# Patient Record
Sex: Male | Born: 1995
Health system: Southern US, Community
[De-identification: ages and names within clinical notes are randomized; demographics above are authoritative.]

## PROBLEM LIST (undated history)

## (undated) DIAGNOSIS — T7840XA Allergy, unspecified, initial encounter: Secondary | ICD-10-CM

## (undated) HISTORY — DX: Allergy, unspecified, initial encounter: T78.40XA

---

## 2017-10-12 ENCOUNTER — Ambulatory Visit: Payer: BLUE CROSS/BLUE SHIELD | Admitting: Family Medicine

## 2017-11-02 ENCOUNTER — Ambulatory Visit: Payer: BLUE CROSS/BLUE SHIELD | Admitting: Family Medicine

## 2017-11-06 ENCOUNTER — Encounter: Payer: Self-pay | Admitting: Family Medicine

## 2017-11-06 ENCOUNTER — Ambulatory Visit: Payer: BLUE CROSS/BLUE SHIELD | Admitting: Family Medicine

## 2017-11-06 VITALS — BP 120/64 | HR 64 | Ht 72.0 in | Wt 188.0 lb

## 2017-11-06 DIAGNOSIS — F329 Major depressive disorder, single episode, unspecified: Secondary | ICD-10-CM

## 2017-11-06 DIAGNOSIS — F4321 Adjustment disorder with depressed mood: Secondary | ICD-10-CM

## 2017-11-06 DIAGNOSIS — Z0000 Encounter for general adult medical examination without abnormal findings: Secondary | ICD-10-CM

## 2017-11-06 DIAGNOSIS — F99 Mental disorder, not otherwise specified: Secondary | ICD-10-CM

## 2017-11-06 DIAGNOSIS — L709 Acne, unspecified: Secondary | ICD-10-CM | POA: Insufficient documentation

## 2017-11-06 NOTE — Patient Instructions (Signed)
Stress and Stress Management Stress is a normal reaction to life events. It is what you feel when life demands more than you are used to or more than you can handle. Some stress can be useful. For example, the stress reaction can help you catch the last bus of the day, study for a test, or meet a deadline at work. But stress that occurs too often or for too long can cause problems. It can affect your emotional health and interfere with relationships and normal daily activities. Too much stress can weaken your immune system and increase your risk for physical illness. If you already have a medical problem, stress can make it worse. What are the causes? All sorts of life events may cause stress. An event that causes stress for one person may not be stressful for another person. Major life events commonly cause stress. These may be positive or negative. Examples include losing your job, moving into a new home, getting married, having a baby, or losing a loved one. Less obvious life events may also cause stress, especially if they occur day after day or in combination. Examples include working long hours, driving in traffic, caring for children, being in debt, or being in a difficult relationship. What are the signs or symptoms? Stress may cause emotional symptoms including, the following:  Anxiety. This is feeling worried, afraid, on edge, overwhelmed, or out of control.  Anger. This is feeling irritated or impatient.  Depression. This is feeling sad, down, helpless, or guilty.  Difficulty focusing, remembering, or making decisions.  Stress may cause physical symptoms, including the following:  Aches and pains. These may affect your head, neck, back, stomach, or other areas of your body.  Tight muscles or clenched jaw.  Low energy or trouble sleeping.  Stress may cause unhealthy behaviors, including the following:  Eating to feel better (overeating) or skipping meals.  Sleeping too little,  too much, or both.  Working too much or putting off tasks (procrastination).  Smoking, drinking alcohol, or using drugs to feel better.  How is this diagnosed? Stress is diagnosed through an assessment by your health care provider. Your health care provider will ask questions about your symptoms and any stressful life events.Your health care provider will also ask about your medical history and may order blood tests or other tests. Certain medical conditions and medicine can cause physical symptoms similar to stress. Mental illness can cause emotional symptoms and unhealthy behaviors similar to stress. Your health care provider may refer you to a mental health professional for further evaluation. How is this treated? Stress management is the recommended treatment for stress.The goals of stress management are reducing stressful life events and coping with stress in healthy ways. Techniques for reducing stressful life events include the following:  Stress identification. Self-monitor for stress and identify what causes stress for you. These skills may help you to avoid some stressful events.  Time management. Set your priorities, keep a calendar of events, and learn to say "no." These tools can help you avoid making too many commitments.  Techniques for coping with stress include the following:  Rethinking the problem. Try to think realistically about stressful events rather than ignoring them or overreacting. Try to find the positives in a stressful situation rather than focusing on the negatives.  Exercise. Physical exercise can release both physical and emotional tension. The key is to find a form of exercise you enjoy and do it regularly.  Relaxation techniques. These relax the body and  mind. Examples include yoga, meditation, tai chi, biofeedback, deep breathing, progressive muscle relaxation, listening to music, being out in nature, journaling, and other hobbies. Again, the key is to find  one or more that you enjoy and can do regularly.  Healthy lifestyle. Eat a balanced diet, get plenty of sleep, and do not smoke. Avoid using alcohol or drugs to relax.  Strong support network. Spend time with family, friends, or other people you enjoy being around.Express your feelings and talk things over with someone you trust.  Counseling or talktherapy with a mental health professional may be helpful if you are having difficulty managing stress on your own. Medicine is typically not recommended for the treatment of stress.Talk to your health care provider if you think you need medicine for symptoms of stress. Follow these instructions at home:  Keep all follow-up visits as directed by your health care provider.  Take all medicines as directed by your health care provider. Contact a health care provider if:  Your symptoms get worse or you start having new symptoms.  You feel overwhelmed by your problems and can no longer manage them on your own. Get help right away if:  You feel like hurting yourself or someone else. This information is not intended to replace advice given to you by your health care provider. Make sure you discuss any questions you have with your health care provider. Document Released: 12/27/2000 Document Revised: 12/09/2015 Document Reviewed: 02/25/2013 Elsevier Interactive Patient Education  2017 [employer]  

## 2017-11-06 NOTE — Progress Notes (Signed)
Name: Anthony LedgerRobert Pecora   MRN: [id number]    DOB: 08/09/1995   Date:11/06/2017       Progress Note  Subjective  Chief Complaint  Chief Complaint  Patient presents with  . Establish Care  . Depression    doesn't want meds    Patient presents to establish care and discuss recent mental health issue.  Depression         This is a new problem.  The current episode started more than 1 month ago.   The onset quality is sudden.   Duration: 4-6 weeks.    The problem has been gradually improving since onset.  Associated symptoms include decreased concentration, helplessness, insomnia, irritable, decreased interest and sad.  Associated symptoms include no fatigue, no hopelessness, no restlessness, no appetite change, no body aches, no myalgias, no headaches and no suicidal ideas.     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  Past treatments include nothing (discuss current and future adaptations).  Compliance with treatment is good.   No problem-specific Assessment & Plan notes found for this encounter.   Past Medical History:  Diagnosis Date  . Allergy     History reviewed. No pertinent surgical history.  Family History  Problem Relation Age of Onset  . Hypertension Mother   . Cancer Maternal Grandmother     Social History   Socioeconomic History  . Marital status: Single    Spouse name: Not on file  . Number of children: Not on file  . Years of education: Not on file  . Highest education level: Not on file  Occupational History  . Not on file  Social Needs  . Financial resource strain: Not on file  . Food insecurity:    Worry: Not on file    Inability: Not on file  . Transportation needs:    Medical: Not on file    Non-medical: Not on file  Tobacco Use  . Smoking status: Never Smoker  . Smokeless tobacco: Never Used  Substance and Sexual Activity  . Alcohol use: Never    Frequency: Never  . Drug use: Yes    Types: Marijuana  . Sexual activity: Not  Currently  Lifestyle  . Physical activity:    Days per week: Not on file    Minutes per session: Not on file  . Stress: Not on file  Relationships  . Social connections:    Talks on phone: Not on file    Gets together: Not on file    Attends religious service: Not on file    Active member of club or organization: Not on file    Attends meetings of clubs or organizations: Not on file    Relationship status: Not on file  . Intimate partner violence:    Fear of current or ex partner: Not on file    Emotionally abused: Not on file    Physically abused: Not on file    Forced sexual activity: Not on file  Other Topics Concern  . Not on file  Social History Narrative  . Not on file    No Known Allergies  No outpatient medications prior to visit.   No facility-administered medications prior to visit.     Review of Systems  Constitutional: Negative for appetite change, chills, fatigue, fever, malaise/fatigue and weight loss.  HENT: Negative for ear discharge, ear pain and sore throat.   Eyes: Negative for blurred vision.  Respiratory: Negative for cough, sputum production, shortness of breath  and wheezing.   Cardiovascular: Negative for chest pain, palpitations and leg swelling.  Gastrointestinal: Negative for abdominal pain, blood in stool, constipation, diarrhea, heartburn, melena and nausea.  Genitourinary: Negative for dysuria, frequency, hematuria and urgency.  Musculoskeletal: Negative for back pain, joint pain, myalgias and neck pain.  Skin: Negative for rash.  Neurological: Negative for dizziness, tingling, sensory change, focal weakness and headaches.  Endo/Heme/Allergies: Negative for environmental allergies and polydipsia. Does not bruise/bleed easily.  Psychiatric/Behavioral: Positive for decreased concentration and depression. Negative for suicidal ideas. The patient is nervous/anxious and has insomnia.      Objective  Vitals:   11/06/17 1333  BP: 120/64   Pulse: 64  Weight: 188 lb (85.3 kg)  Height: 6' (1.829 m)    Physical Exam  Constitutional: He is oriented to person, place, and time. He is irritable.  HENT:  Head: Normocephalic.  Right Ear: External ear normal.  Left Ear: External ear normal.  Nose: Nose normal.  Mouth/Throat: Oropharynx is clear and moist.  Eyes: Pupils are equal, round, and reactive to light. Conjunctivae and EOM are normal. Right eye exhibits no discharge. Left eye exhibits no discharge. No scleral icterus.  Neck: Normal range of motion. Neck supple. No JVD present. No tracheal deviation present. No thyromegaly present.  Cardiovascular: Normal rate, regular rhythm, normal heart sounds and intact distal pulses. Exam reveals no gallop and no friction rub.  No murmur heard. Pulmonary/Chest: Breath sounds normal. No respiratory distress. He has no wheezes. He has no rales.  Abdominal: Soft. Bowel sounds are normal. He exhibits no mass. There is no hepatosplenomegaly. There is no tenderness. There is no rebound, no guarding and no CVA tenderness.  Musculoskeletal: Normal range of motion. He exhibits no edema or tenderness.  Lymphadenopathy:    He has no cervical adenopathy.  Neurological: He is alert and oriented to person, place, and time. He has normal strength and normal reflexes. No cranial nerve deficit.  Skin: Skin is warm. No rash noted.  Nursing note and vitals reviewed.     Assessment & Plan  Problem List Items Addressed This Visit    None    Visit Diagnoses    Encounter for medical examination to establish care    -  Primary   Grief reaction       Reactive depression       patient to explore counseling opportunities at school   Psychiatric disturbance          No orders of the defined types were placed in this encounter.     Dr. Hayden [facility]  11/06/17

## 2017-12-18 ENCOUNTER — Encounter: Payer: Self-pay | Admitting: Family Medicine

## 2017-12-18 ENCOUNTER — Ambulatory Visit: Payer: BLUE CROSS/BLUE SHIELD | Admitting: Family Medicine

## 2017-12-18 VITALS — BP 120/68 | HR 76 | Ht 72.0 in | Wt 184.0 lb

## 2017-12-18 DIAGNOSIS — J301 Allergic rhinitis due to pollen: Secondary | ICD-10-CM | POA: Diagnosis not present

## 2017-12-18 MED ORDER — FLUTICASONE PROPIONATE 50 MCG/ACT NA SUSP: 2.0000 | Freq: Every day | NASAL | 6 refills | Status: DC

## 2017-12-18 MED ORDER — MONTELUKAST SODIUM 10 MG PO TABS
10.0000 mg | ORAL_TABLET | Freq: Every day | ORAL | 3 refills | Status: AC
Start: 2017-12-18 — End: ?

## 2017-12-18 NOTE — Progress Notes (Signed)
Name: Anthony Munoz   MRN: [id number]    DOB: 29-Mar-1996   Date:12/18/2017       Progress Note  Subjective  Chief Complaint  Chief Complaint  Patient presents with  . Follow-up    doing better- no meds, working now  . Allergic Rhinitis     Rx Zyrtec    Depression         This is a new problem.  The current episode started more than 1 month ago.   The onset quality is gradual.   The problem occurs rarely.  The problem has been gradually improving since onset.  Associated symptoms include no decreased concentration, no fatigue, no helplessness, no hopelessness, does not have insomnia, not irritable, no restlessness, no decreased interest, no appetite change, no body aches, no myalgias, no headaches, no indigestion, not sad and no suicidal ideas.  Past treatments include nothing (self monitoring).  Compliance with treatment is good.  Previous treatment provided significant relief.   No problem-specific Assessment & Plan notes found for this encounter.   Past Medical History:  Diagnosis Date  . Allergy     No past surgical history on file.  Family History  Problem Relation Age of Onset  . Hypertension Mother   . Cancer Maternal Grandmother     Social History   Socioeconomic History  . Marital status: Single    Spouse name: Not on file  . Number of children: Not on file  . Years of education: Not on file  . Highest education level: Not on file  Occupational History  . Not on file  Social Needs  . Financial resource strain: Not on file  . Food insecurity:    Worry: Not on file    Inability: Not on file  . Transportation needs:    Medical: Not on file    Non-medical: Not on file  Tobacco Use  . Smoking status: Never Smoker  . Smokeless tobacco: Never Used  Substance and Sexual Activity  . Alcohol use: Never    Frequency: Never  . Drug use: Yes    Types: Marijuana  . Sexual activity: Not Currently  Lifestyle  . Physical activity:    Days per week: Not on file     Minutes per session: Not on file  . Stress: Not on file  Relationships  . Social connections:    Talks on phone: Not on file    Gets together: Not on file    Attends religious service: Not on file    Active member of club or organization: Not on file    Attends meetings of clubs or organizations: Not on file    Relationship status: Not on file  . Intimate partner violence:    Fear of current or ex partner: Not on file    Emotionally abused: Not on file    Physically abused: Not on file    Forced sexual activity: Not on file  Other Topics Concern  . Not on file  Social History Narrative  . Not on file    No Known Allergies  No outpatient medications prior to visit.   No facility-administered medications prior to visit.     Review of Systems  Constitutional: Negative for appetite change, chills, fatigue, fever, malaise/fatigue and weight loss.  HENT: Negative for ear discharge, ear pain and sore throat.   Eyes: Negative for blurred vision.  Respiratory: Negative for cough, sputum production, shortness of breath and wheezing.   Cardiovascular: Negative for chest pain,  palpitations and leg swelling.  Gastrointestinal: Negative for abdominal pain, blood in stool, constipation, diarrhea, heartburn, melena and nausea.  Genitourinary: Negative for dysuria, frequency, hematuria and urgency.  Musculoskeletal: Negative for back pain, joint pain, myalgias and neck pain.  Skin: Negative for rash.  Neurological: Negative for dizziness, tingling, sensory change, focal weakness and headaches.  Endo/Heme/Allergies: Negative for environmental allergies and polydipsia. Does not bruise/bleed easily.  Psychiatric/Behavioral: Positive for depression. Negative for decreased concentration and suicidal ideas. The patient is not nervous/anxious and does not have insomnia.      Objective  Vitals:   12/18/17 1349  BP: 120/68  Pulse: 76  Weight: 184 lb (83.5 kg)  Height: 6' (1.829 m)     Physical Exam  Constitutional: He is oriented to person, place, and time. He is not irritable.  HENT:  Head: Normocephalic.  Right Ear: External ear normal.  Left Ear: External ear normal.  Nose: Nose normal.  Mouth/Throat: Oropharynx is clear and moist.  Eyes: Pupils are equal, round, and reactive to light. Conjunctivae and EOM are normal. Right eye exhibits no discharge. Left eye exhibits no discharge. No scleral icterus.  Neck: Normal range of motion. Neck supple. No JVD present. No tracheal deviation present. No thyromegaly present.  Cardiovascular: Normal rate, regular rhythm, normal heart sounds and intact distal pulses. Exam reveals no gallop and no friction rub.  No murmur heard. Pulmonary/Chest: Breath sounds normal. No respiratory distress. He has no wheezes. He has no rales.  Abdominal: Soft. Bowel sounds are normal. He exhibits no mass. There is no hepatosplenomegaly. There is no tenderness. There is no rebound, no guarding and no CVA tenderness.  Musculoskeletal: Normal range of motion. He exhibits no edema or tenderness.  Lymphadenopathy:    He has no cervical adenopathy.  Neurological: He is alert and oriented to person, place, and time. He has normal strength and normal reflexes. No cranial nerve deficit.  Skin: Skin is warm. No rash noted.  Nursing note and vitals reviewed.     Assessment & Plan  Problem List Items Addressed This Visit    None    Visit Diagnoses    Seasonal allergic rhinitis due to pollen    -  Primary   Will trial singulair coupled with nonsedating antihistamine and fluticasone   Relevant Medications   fluticasone (FLONASE) 50 MCG/ACT nasal spray      Meds ordered this encounter  Medications  . montelukast (SINGULAIR) 10 MG tablet    Sig: Take 1 tablet (10 mg total) by mouth at bedtime.    Dispense:  30 tablet    Refill:  3  . fluticasone (FLONASE) 50 MCG/ACT nasal spray    Sig: Place 2 sprays into both nostrils daily.    Dispense:   16 g    Refill:  6      Dr. Elizabeth Sauereanna Kevon [facility]  12/18/17

## 2019-03-03 ENCOUNTER — Encounter: Payer: Self-pay | Admitting: Emergency Medicine

## 2019-03-03 ENCOUNTER — Other Ambulatory Visit: Payer: Self-pay

## 2019-03-03 ENCOUNTER — Ambulatory Visit
Admission: EM | Admit: 2019-03-03 | Discharge: 2019-03-03 | Disposition: A | Discharge status: Home / Self Care | Payer: BLUE CROSS/BLUE SHIELD | Attending: Family Medicine | Admitting: Family Medicine

## 2019-03-03 DIAGNOSIS — Z020 Encounter for examination for admission to educational institution: Secondary | ICD-10-CM

## 2019-03-03 DIAGNOSIS — Z7189 Other specified counseling: Secondary | ICD-10-CM

## 2019-03-03 NOTE — Discharge Instructions (Signed)
We will release results to my chart.  Take care  Dr. Lacinda Axon

## 2019-03-03 NOTE — ED Triage Notes (Signed)
Patient here for a COVID test to go back to school

## 2019-03-03 NOTE — ED Provider Notes (Signed)
[facility]    CSN: [id number] Arrival date & time: 03/03/19  1430  History   Chief Complaint Chief Complaint  Patient presents with  . Labs Only   HPI  23 year old male presents requesting COVID-19 testing.  Patient states that he is returning to college.  His classes start next week.  He has been instructed by the school that he has to have COVID testing before he can resume classes.  He is currently feeling well.  No cough, fever, shortness of breath.  He desires currently testing today.  No other complaints.  PMH, Surgical Hx, Family Hx, Social History reviewed and updated as below.  Past Medical History:  Diagnosis Date  . Allergy   Reactive depression  Patient Active Problem List   Diagnosis Date Noted  . Acne 11/06/2017   Home Medications    Prior to Admission medications   Medication Sig Start Date End Date Taking? Authorizing Provider  fluticasone (FLONASE) 50 MCG/ACT nasal spray Place 2 sprays into both nostrils daily. 12/18/17  Yes Juline Patch, MD  montelukast (SINGULAIR) 10 MG tablet Take 1 tablet (10 mg total) by mouth at bedtime. 12/18/17  Yes Juline Patch, MD    Family History Family History  Problem Relation Age of Onset  . Hypertension Mother   . Cancer Mother   . Cancer Maternal Grandmother   . COPD Father     Social History Social History   Tobacco Use  . Smoking status: Never Smoker  . Smokeless tobacco: Never Used  Substance Use Topics  . Alcohol use: Never    Frequency: Never  . Drug use: Yes    Types: Marijuana     Allergies   Patient has no known allergies.   Review of Systems Review of Systems  Constitutional: Negative.   Respiratory: Negative.    Physical Exam Triage Vital Signs ED Triage Vitals [03/03/19 1447]  Enc Vitals Group     BP 121/82     Pulse Rate 74     Resp 16     Temp 98.4 F (36.9 C)     Temp src      SpO2 100 %     Weight 184 lb (83.5 kg)     Height      Head Circumference    Peak Flow      Pain Score 0     Pain Loc      Pain Edu?      Excl. in [place]?    Updated Vital Signs BP 121/82 (BP Location: Left Arm)   Pulse 74   Temp 98.4 F (36.9 C)   Resp 16   Wt 83.5 kg   SpO2 100%   BMI 24.95 kg/m   Visual Acuity Right Eye Distance:   Left Eye Distance:   Bilateral Distance:    Right Eye Near:   Left Eye Near:    Bilateral Near:     Physical Exam Constitutional:      General: He is not in acute distress.    Appearance: Normal appearance.  HENT:     Head: Normocephalic and atraumatic.  Eyes:     General:        Right eye: No discharge.        Left eye: No discharge.     Conjunctiva/sclera: Conjunctivae normal.  Cardiovascular:     Rate and Rhythm: Normal rate and regular rhythm.     Heart sounds: No murmur.  Pulmonary:  Effort: Pulmonary effort is normal.     Breath sounds: Normal breath sounds. No wheezing, rhonchi or rales.  Neurological:     Mental Status: He is alert.  Psychiatric:        Behavior: Behavior normal.     Comments: Flat affect.    UC Treatments / Results  Labs (all labs ordered are listed, but only abnormal results are displayed) Labs Reviewed  NOVEL CORONAVIRUS, NAA (HOSPITAL ORDER, SEND-OUT TO REF LAB)    EKG   Radiology No results found.  Procedures Procedures (including critical care time)  Medications Ordered in UC Medications - No data to display  Initial Impression / Assessment and Plan / UC Course  I have reviewed the triage vital signs and the nursing notes.  Pertinent labs & imaging results that were available during my care of the patient were reviewed by me and considered in my medical decision making (see chart for details).    23 year old male presents for COVID testing.  Testing done today.  Final Clinical Impressions(s) / UC Diagnoses   Final diagnoses:  Advice Given About Covid-19 Virus Infection     Discharge Instructions     We will release results to my chart.  Take  care  Dr. Adriana Simasook    ED Prescriptions    None     Controlled Substance Prescriptions [place] Controlled Substance Registry consulted? Not Applicable   Tommie SamsCook, Kenetha Cozza G, DO 03/03/19 1542

## 2019-03-04 LAB — NOVEL CORONAVIRUS, NAA (HOSP ORDER, SEND-OUT TO REF LAB; TAT 18-24 HRS): SARS-CoV-2, NAA: NOT DETECTED

## 2019-07-30 ENCOUNTER — Ambulatory Visit: Payer: BLUE CROSS/BLUE SHIELD | Attending: Internal Medicine

## 2019-07-30 DIAGNOSIS — Z20822 Contact with and (suspected) exposure to covid-19: Secondary | ICD-10-CM

## 2019-07-31 LAB — NOVEL CORONAVIRUS, NAA: SARS-CoV-2, NAA: DETECTED — AB

## 2019-08-13 ENCOUNTER — Ambulatory Visit (INDEPENDENT_AMBULATORY_CARE_PROVIDER_SITE_OTHER): Payer: BLUE CROSS/BLUE SHIELD | Admitting: Family Medicine

## 2019-08-13 ENCOUNTER — Encounter: Payer: Self-pay | Admitting: Family Medicine

## 2019-08-13 VITALS — Temp 96.2°F | Ht 72.0 in | Wt 185.0 lb

## 2019-08-13 DIAGNOSIS — U071 COVID-19: Secondary | ICD-10-CM

## 2019-08-13 NOTE — Progress Notes (Signed)
Date:  08/13/2019   Name:  Anthony Munoz   DOB:  1995-12-29   MRN:  [id number]   Chief Complaint: Follow-up (covid positive on Jan 13- no symptoms prior to testing, still has no taste or smell. No fevers)  I connected withthis patient, Anthony Munoz, by telephoneat the patient's home.  I verified that I am speaking with the correct person using two identifiers. This visit was conducted via telephone due to the Covid-19 outbreak from my office at [facility] in [place], [place]. I discussed the limitations, risks, security and privacy concerns of performing an evaluation and management service by telephone. I also discussed with the patient that there may be a patient responsible charge related to this service. The patient expressed understanding and agreed to proceed.                                                                    This is Dr. Otilio Miu and I am conducting a televisit with Anthony Munoz.  Patient is status post Covid infection.  Currently patient continues to have a distortion of his taste.  However he no longer has a fever, difficulty breathing, nasal congestion, cough, upper respiratory symptoms, extensive body aches, and has had no further exposure to anyone positive for Covid.  Patient was diagnosed with Covid on January 13 and had no symptoms prior to the test and the testing was necessary to be able to move back into housing.  Per protocol with health department patient quarantined for 2 weeks from the testing result.  And has except for premention taste concerns has recovered completely.  The patient currently has no continued symptoms and is not at risk for any transmission in the event that he has had sufficient quarantine for asymptomatic Covid infection.  Patient is no longer at risk for transmission nor continuance of disease.  Patient is therefore cleared for return to face-to-face instruction and housing circumstances.   No results found for: CREATININE, BUN,  NA, K, CL, CO2 No results found for: CHOL, HDL, LDLCALC, LDLDIRECT, TRIG, CHOLHDL No results found for: TSH No results found for: HGBA1C   Review of Systems  Constitutional: Negative for chills and fever.  HENT: Negative for drooling, ear discharge, ear pain, nosebleeds, postnasal drip, rhinorrhea and sore throat.   Respiratory: Negative for cough, choking, chest tightness, shortness of breath, wheezing and stridor.   Cardiovascular: Negative for chest pain, palpitations and leg swelling.  Gastrointestinal: Negative for abdominal pain, blood in stool, constipation, diarrhea and nausea.  Endocrine: Negative for polydipsia.  Genitourinary: Negative for dysuria, frequency, hematuria and urgency.  Musculoskeletal: Negative for back pain, myalgias and neck pain.  Skin: Negative for rash.  Allergic/Immunologic: Negative for environmental allergies.  Neurological: Negative for dizziness and headaches.  Hematological: Does not bruise/bleed easily.  Psychiatric/Behavioral: Negative for suicidal ideas. The patient is not nervous/anxious.     Patient Active Problem List   Diagnosis Date Noted  . Acne 11/06/2017    No Known Allergies  No past surgical history on file.  Social History   Tobacco Use  . Smoking status: Never Smoker  . Smokeless tobacco: Never Used  Substance Use Topics  . Alcohol use: Never  . Drug use: Yes  Types: Marijuana     Medication list has been reviewed and updated.  Current Meds  Medication Sig  . fluticasone (FLONASE) 50 MCG/ACT nasal spray Place 2 sprays into both nostrils daily.  . montelukast (SINGULAIR) 10 MG tablet Take 1 tablet (10 mg total) by mouth at bedtime.    PHQ 2/9 Scores 08/13/2019 12/18/2017 11/06/2017  PHQ - 2 Score 1 0 2  PHQ- 9 Score 1 1 3     BP Readings from Last 3 Encounters:  03/03/19 121/82  12/18/17 120/68  11/06/17 120/64    Physical Exam  Wt Readings from Last 3 Encounters:  08/13/19 185 lb (83.9 kg)  03/03/19 184  lb (83.5 kg)  12/18/17 184 lb (83.5 kg) (84 %, Z= 0.98)*   * Growth percentiles are based on CDC (Boys, 2-20 Years) data.    Temp (!) 96.2 F (35.7 C) (Oral)   Ht 6' (1.829 m)   Wt 185 lb (83.9 kg)   BMI 25.09 kg/m   Assessment and Plan: 1. Laboratory confirmed diagnosis of COVID-19 Per protocol by patient's school he had to undergo testing before returning to housing and patient was noted to be positive at that time even though he was asymptomatic.  Since then he is only had difficulty with his taste and that is ongoing but there is no further symptoms of concern and he is beyond his quarantine session of 2 weeks.  Patient is no longer at risk for transmission or continuance of disease.  Therefore patient may return to housing and face-to-face instruction with no contraindication or concerns of transmission.  Patient will be provided letter for presentation for to return to housing and instruction.

## 2019-11-26 ENCOUNTER — Ambulatory Visit: Payer: Self-pay | Attending: Internal Medicine

## 2019-11-26 DIAGNOSIS — Z23 Encounter for immunization: Secondary | ICD-10-CM

## 2019-11-26 NOTE — Progress Notes (Signed)
   Covid-19 Vaccination Clinic  Name:  SAHAND GOSCH    MRN: [id number] DOB: Sep 17, 1995  11/26/2019  Mr. Hrdlicka was observed post Covid-19 immunization for 15 minutes without incident. He was provided with Vaccine Information Sheet and instruction to access the V-Safe system.   Mr. Ramdass was instructed to call 911 with any severe reactions post vaccine: Marland Kitchen Difficulty breathing  . Swelling of face and throat  . A fast heartbeat  . A bad rash all over body  . Dizziness and weakness   Immunizations Administered    Name Date Dose VIS Date Route   Pfizer COVID-19 Vaccine 11/26/2019 11:09 AM 0.3 mL 09/10/2018 Intramuscular   Manufacturer: [employer], [employer]   Lot: [id number]   NDC: [address]-3

## 2020-02-25 ENCOUNTER — Ambulatory Visit
Admission: EM | Admit: 2020-02-25 | Discharge: 2020-02-25 | Disposition: A | Discharge status: Home / Self Care | Payer: 59 | Attending: Family Medicine | Admitting: Family Medicine

## 2020-02-25 ENCOUNTER — Ambulatory Visit: Payer: Self-pay | Admitting: *Deleted

## 2020-02-25 ENCOUNTER — Other Ambulatory Visit: Payer: Self-pay

## 2020-02-25 ENCOUNTER — Ambulatory Visit (INDEPENDENT_AMBULATORY_CARE_PROVIDER_SITE_OTHER): Payer: 59

## 2020-02-25 ENCOUNTER — Telehealth: Payer: Self-pay | Admitting: Family Medicine

## 2020-02-25 DIAGNOSIS — J988 Other specified respiratory disorders: Secondary | ICD-10-CM | POA: Diagnosis present

## 2020-02-25 DIAGNOSIS — Z20822 Contact with and (suspected) exposure to covid-19: Secondary | ICD-10-CM | POA: Insufficient documentation

## 2020-02-25 LAB — GROUP A STREP BY PCR: Group A Strep by PCR: NOT DETECTED

## 2020-02-25 LAB — SARS CORONAVIRUS 2 (TAT 6-24 HRS): SARS Coronavirus 2: NEGATIVE

## 2020-02-25 MED ORDER — DOXYCYCLINE HYCLATE 100 MG PO CAPS: 100.0000 mg | ORAL_CAPSULE | Freq: Two times a day (BID) | ORAL | 0 refills | Status: DC

## 2020-02-25 MED ORDER — PREDNISONE 50 MG PO TABS: ORAL_TABLET | ORAL | 0 refills | Status: DC

## 2020-02-25 NOTE — Telephone Encounter (Signed)
Go to urgent care- note

## 2020-02-25 NOTE — Telephone Encounter (Signed)
Pt returned call. Advised UC per Dr. Yetta Barre' message. States will follow disposition. Reports SOB at rest, productive cough, chest tightness. Advised to alert UC upon arrival, wear mask.

## 2020-02-25 NOTE — ED Triage Notes (Signed)
Patient in today w/ c/o cough, fever, chills, hoarseness, sore throat intermittently, SOB, H/A, fatigue, and generalized B/A. Onset of sx began last Thurs or Friday per patient. Patient states he has no sense smell since yesterday, but still has sense of taste.

## 2020-02-25 NOTE — ED Provider Notes (Signed)
[facility]    CSN: [id number] Arrival date & time: 02/25/20  1007  History   Chief Complaint Chief Complaint  Patient presents with  . Sore Throat  . Cough  . Fever  . Headache  . Hoarse   HPI  24 year old male presents with respiratory symptoms.  Patient reports that he has been sick since since last Thursday.  He reports shortness of breath, fever, chills, headache, cough, runny nose, hoarseness.  He states that he has lost his sense of smell.  Has not sought care until today.  Feels very poorly.  Most bothered by the cough.  No pain at this time.  No other associated symptoms.  No other complaints.  Past Medical History:  Diagnosis Date  . Allergy    Patient Active Problem List   Diagnosis Date Noted  . Acne 11/06/2017   Home Medications    Prior to Admission medications   Medication Sig Start Date End Date Taking? Authorizing Provider  fluticasone (FLONASE) 50 MCG/ACT nasal spray Place 2 sprays into both nostrils daily. 12/18/17  Yes Duanne Limerick, MD  montelukast (SINGULAIR) 10 MG tablet Take 1 tablet (10 mg total) by mouth at bedtime. 12/18/17  Yes Duanne Limerick, MD  doxycycline (VIBRAMYCIN) 100 MG capsule Take 1 capsule (100 mg total) by mouth 2 (two) times daily. 02/25/20   Tommie Sams, DO  predniSONE (DELTASONE) 50 MG tablet 1 tablet daily x 5 days 02/25/20   Tommie Sams, DO    Family History Family History  Problem Relation Age of Onset  . Hypertension Mother   . Cancer Mother   . Cancer Maternal Grandmother   . COPD Father     Social History Social History   Tobacco Use  . Smoking status: Never Smoker  . Smokeless tobacco: Never Used  Vaping Use  . Vaping Use: Never used  Substance Use Topics  . Alcohol use: Never  . Drug use: Yes    Types: Marijuana     Allergies   Patient has no known allergies.   Review of Systems Review of Systems Per HPI  Physical Exam Triage Vital Signs ED Triage Vitals  Enc Vitals Group      BP 02/25/20 1118 136/88     Pulse Rate 02/25/20 1118 71     Resp 02/25/20 1118 16     Temp 02/25/20 1118 98.3 F (36.8 C)     Temp Source 02/25/20 1118 Oral     SpO2 02/25/20 1118 95 %     Weight 02/25/20 1121 185 lb (83.9 kg)     Height 02/25/20 1121 5\' 11"  (1.803 m)     Head Circumference --      Peak Flow --      Pain Score 02/25/20 1120 0     Pain Loc --      Pain Edu? --      Excl. in GC? --    Updated Vital Signs BP 136/88 (BP Location: Left Arm)   Pulse 71   Temp 98.3 F (36.8 C) (Oral)   Resp 16   Ht 5\' 11"  (1.803 m)   Wt 83.9 kg   SpO2 95%   BMI 25.80 kg/m   Visual Acuity Right Eye Distance:   Left Eye Distance:   Bilateral Distance:    Right Eye Near:   Left Eye Near:    Bilateral Near:     Physical Exam Vitals and nursing note reviewed.  Constitutional:  General: He is not in acute distress.    Appearance: Normal appearance. He is not ill-appearing.  HENT:     Head: Normocephalic and atraumatic.  Eyes:     General:        Right eye: No discharge.        Left eye: No discharge.     Conjunctiva/sclera: Conjunctivae normal.  Cardiovascular:     Rate and Rhythm: Normal rate and regular rhythm.  Pulmonary:     Effort: Pulmonary effort is normal.     Breath sounds: Wheezing present.  Neurological:     Mental Status: He is alert.  Psychiatric:        Mood and Affect: Mood normal.        Behavior: Behavior normal.    UC Treatments / Results  Labs (all labs ordered are listed, but only abnormal results are displayed) Labs Reviewed  GROUP A STREP BY PCR  SARS CORONAVIRUS 2 (TAT 6-24 HRS)    EKG   Radiology DG Chest 2 View  Result Date: 02/25/2020 CLINICAL DATA:  Cough and shortness of breath with fever EXAM: CHEST - 2 VIEW COMPARISON:  None. FINDINGS: Lungs are clear. Heart size and pulmonary vascularity are normal. No adenopathy. There is midthoracic dextroscoliosis. IMPRESSION: Lungs clear.  Cardiac silhouette normal. Electronically  Signed   By: Bretta Bang III M.D.   On: 02/25/2020 12:25    Procedures Procedures (including critical care time)  Medications Ordered in UC Medications - No data to display  Initial Impression / Assessment and Plan / UC Course  I have reviewed the triage vital signs and the nursing notes.  Pertinent labs & imaging results that were available during my care of the patient were reviewed by me and considered in my medical decision making (see chart for details).    24 year old male presents with a respiratory infection.  Chest x-ray obtained given wheezing on exam and respiratory complaints in the setting of fever.  Chest x-ray obtained and independently reviewed by me.  Interpretation: No cardiopulmonary abnormalities.  Awaiting Covid test results.  Placing empirically on doxycycline and prednisone.  Final Clinical Impressions(s) / UC Diagnoses   Final diagnoses:  Respiratory infection     Discharge Instructions     Medications as prescribed.  COVID test result will be back tomorrow.  Take care  Dr. Adriana Simas    ED Prescriptions    Medication Sig Dispense Auth. Provider   doxycycline (VIBRAMYCIN) 100 MG capsule Take 1 capsule (100 mg total) by mouth 2 (two) times daily. 14 capsule Hansika Leaming G, DO   predniSONE (DELTASONE) 50 MG tablet 1 tablet daily x 5 days 5 tablet Everlene Other G, DO     PDMP not reviewed this encounter.   Tommie Sams, DO 02/25/20 1350

## 2020-02-25 NOTE — Telephone Encounter (Unsigned)
Copied from CRM [id number]. Topic: Appointment Scheduling - Scheduling Inquiry for Clinic >> Feb 25, 2020  8:11 AM Elliot Gault wrote: [name] name: Anthony Munoz  Relation to pt: mother  Call back number: [phone]    Reason for call:  Patient mother states patient was dx with COVID and exp the following coughing, congestion, ? upper respiratory discomfort, currently no fever, mother seeking clinical advice. Patient PCP has no available appts will send a CRM requesting a work in (caller is aware)

## 2020-02-25 NOTE — Telephone Encounter (Signed)
Summary: COVID    Caller name: Rontae Inglett  Relation to pt: mother  Call back number: [phone]    Reason for call:  Patient mother states patient was dx with COVID and exp the following coughing, congestion, ? upper respiratory discomfort, currently no fever, mother seeking clinical advice. Patient PCP has no available appts will send a CRM requesting a work in (caller is aware)      Tera from Dr Yetta Barre office called mother- while I was on line and she advised mother to have patient go to UC. Mother is in rehab center- so I am reaching out to patient. Call to patient- left message to call office back.

## 2020-02-25 NOTE — Discharge Instructions (Signed)
Medications as prescribed.  COVID test result will be back tomorrow.  Take care  Dr. Corbitt Cloke  

## 2020-02-25 NOTE — Telephone Encounter (Signed)
Pt has been tested at [facility]- no results back. Told to go to urgent care, but call first and see what they think

## 2022-01-26 IMAGING — CR DG CHEST 2V
3 series · 3 of 3 positions shown · non-contrast
Comparison: None.

CLINICAL DATA: Cough and shortness of breath with fever

EXAM:
CHEST - 2 VIEW

[chest pa (1 of 2)]
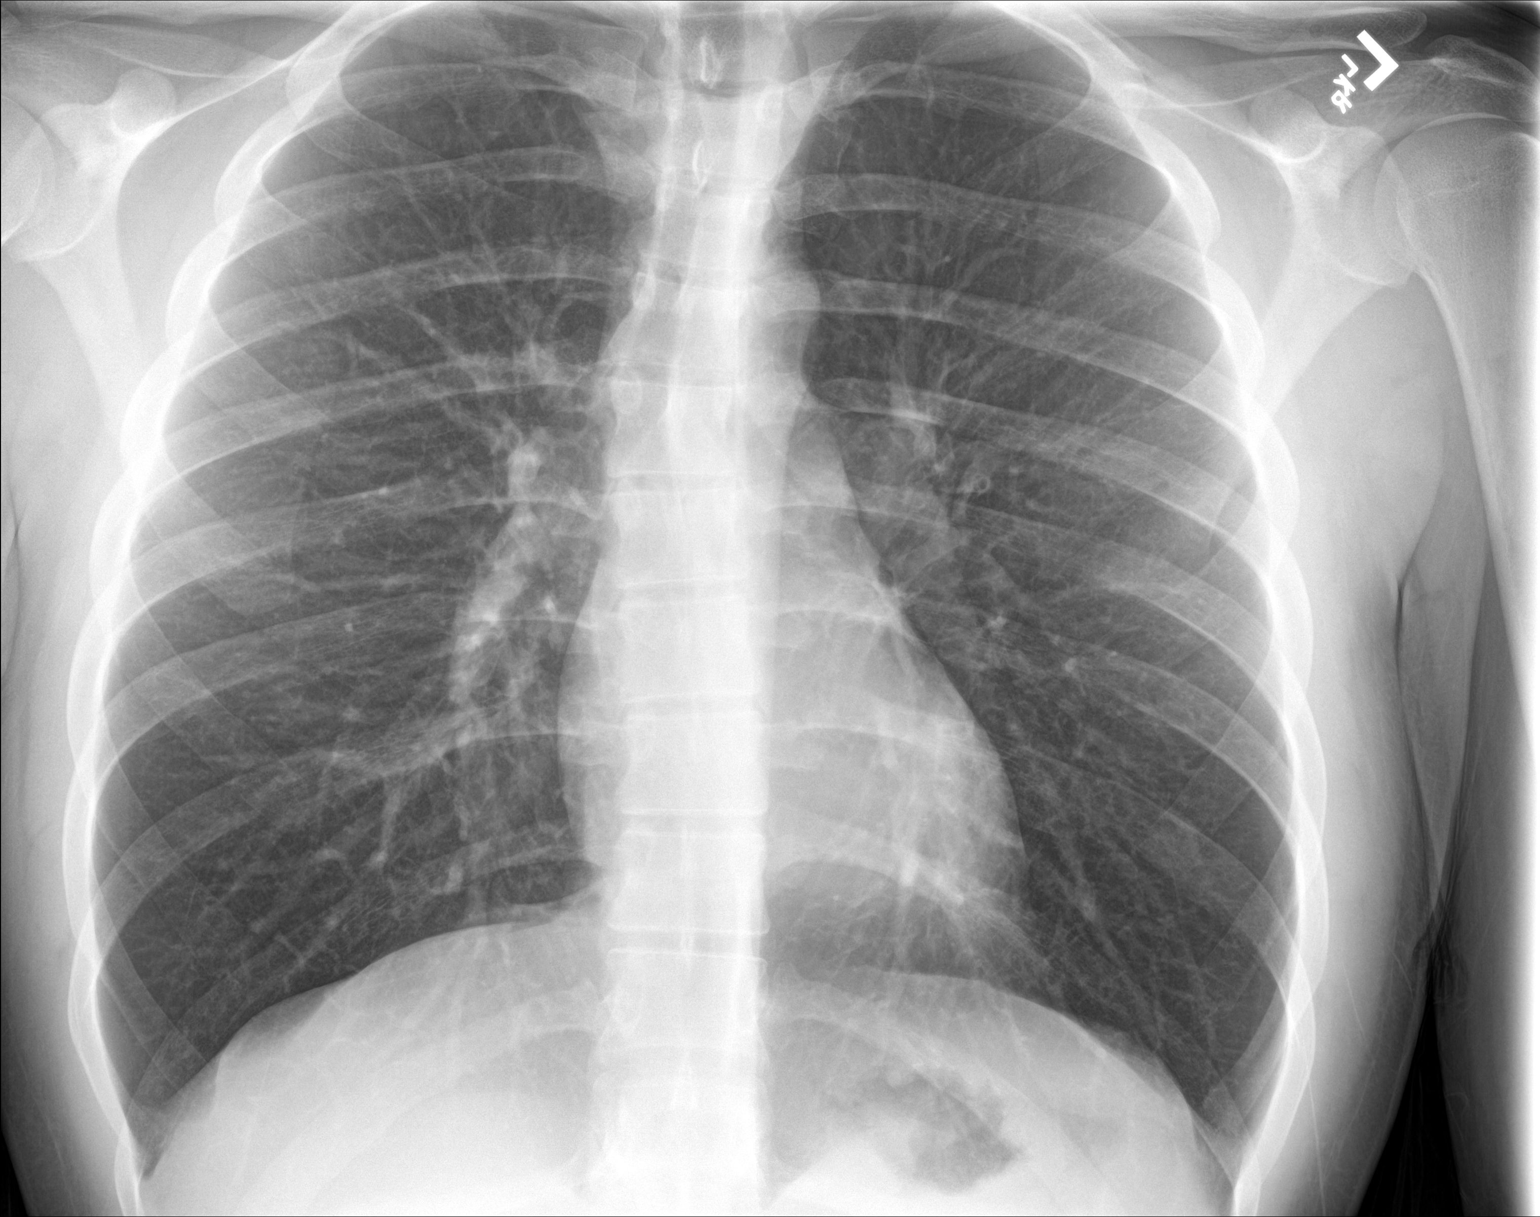

[chest lat]
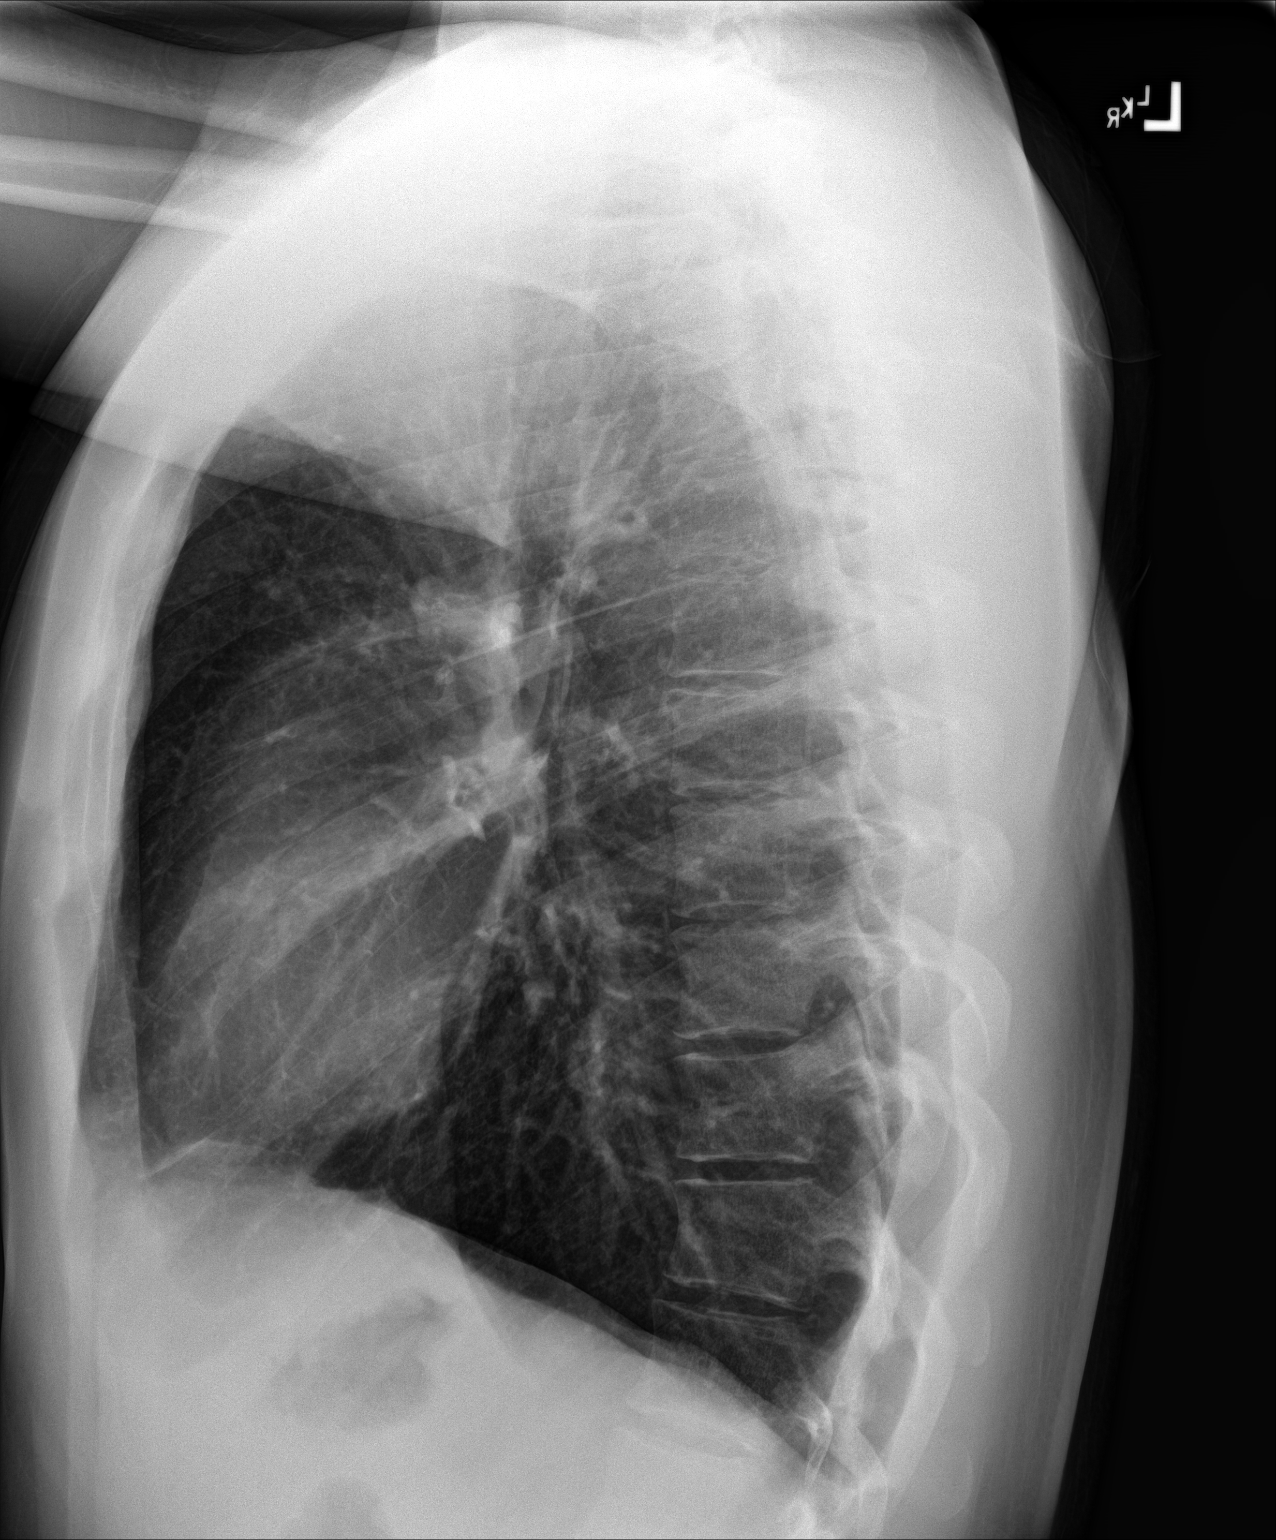

[chest pa (2 of 2)]
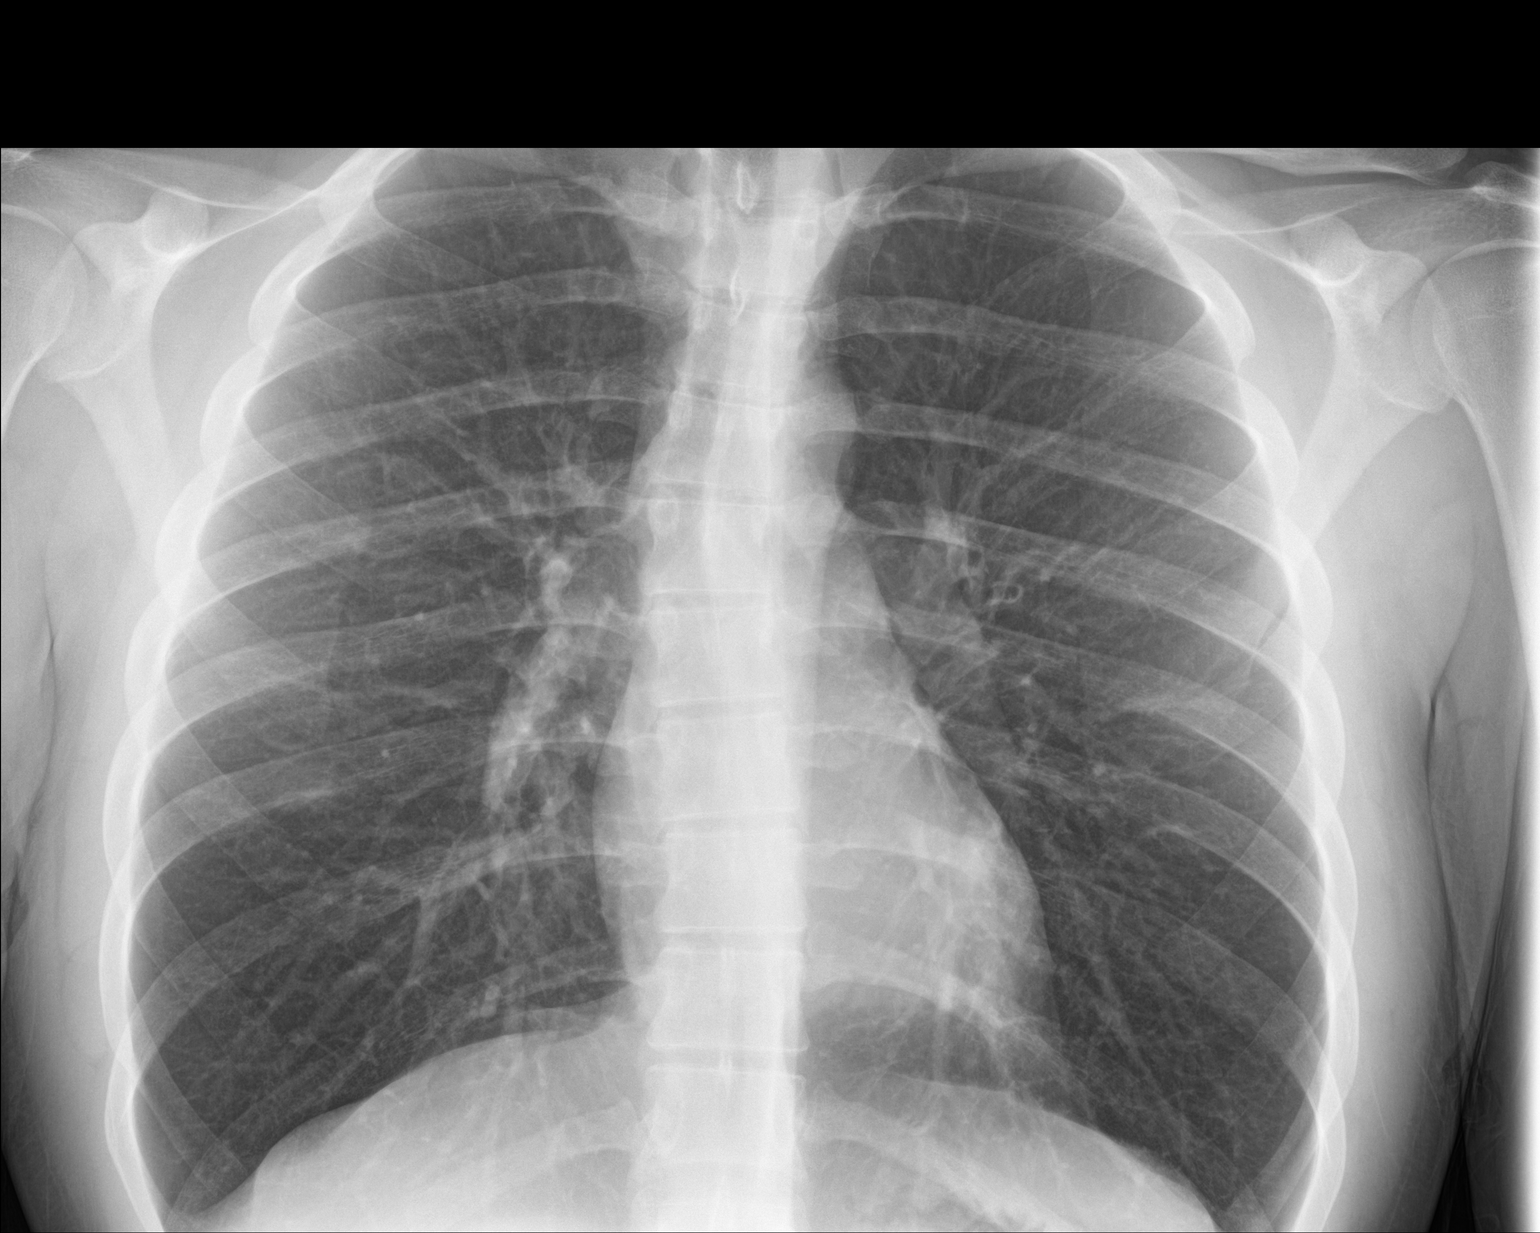

[3 of 3 positions shown; findings below may reference images not displayed]

FINDINGS: Lungs are clear. Heart size and pulmonary vascularity are normal. No
adenopathy. There is midthoracic dextroscoliosis.
IMPRESSION: Lungs clear.  Cardiac silhouette normal.

## 2022-08-31 ENCOUNTER — Encounter: Payer: Self-pay | Admitting: Family Medicine

## 2022-08-31 ENCOUNTER — Ambulatory Visit (INDEPENDENT_AMBULATORY_CARE_PROVIDER_SITE_OTHER): Payer: Self-pay | Admitting: Family Medicine

## 2022-08-31 VITALS — BP 128/78 | HR 87 | Ht 71.0 in | Wt 192.0 lb

## 2022-08-31 DIAGNOSIS — F329 Major depressive disorder, single episode, unspecified: Secondary | ICD-10-CM

## 2022-08-31 DIAGNOSIS — F419 Anxiety disorder, unspecified: Secondary | ICD-10-CM

## 2022-08-31 NOTE — Progress Notes (Signed)
Date:  08/31/2022   Name:  Anthony Munoz   DOB:  06/15/1996   MRN:  [id number]   Chief Complaint: Depression (44 and 16)  Depression        This is a new problem.  The current episode started 1 to 4 weeks ago.   The onset quality is gradual (death of parent).   The problem has been gradually worsening since onset.  Associated symptoms include decreased concentration, fatigue, helplessness, hopelessness, insomnia, restlessness, decreased interest, appetite change, sad and suicidal ideas.  Past treatments include nothing.  Risk factors include major life event.   Past medical history includes anxiety.   Anxiety Presents for initial visit. Symptoms include decreased concentration, depressed mood, excessive worry, insomnia, nervous/anxious behavior, panic, restlessness and suicidal ideas.      No results found for: "NA", "K", "CO2", "GLUCOSE", "BUN", "CREATININE", "CALCIUM", "EGFR", "GFRNONAA" No results found for: "CHOL", "HDL", "LDLCALC", "LDLDIRECT", "TRIG", "CHOLHDL" No results found for: "TSH" No results found for: "HGBA1C" No results found for: "WBC", "HGB", "HCT", "MCV", "PLT" No results found for: "ALT", "AST", "GGT", "ALKPHOS", "BILITOT" No results found for: "25OHVITD2", "25OHVITD3", "VD25OH"   Review of Systems  Constitutional:  Positive for appetite change and fatigue.  Psychiatric/Behavioral:  Positive for decreased concentration, depression and suicidal ideas. Negative for behavioral problems. The patient is nervous/anxious and has insomnia.     Patient Active Problem List   Diagnosis Date Noted   Acne 11/06/2017    No Known Allergies  No past surgical history on file.  Social History   Tobacco Use   Smoking status: Never   Smokeless tobacco: Never  Vaping Use   Vaping Use: Never used  Substance Use Topics   Alcohol use: Never   Drug use: Yes    Types: Marijuana     Medication list has been reviewed and updated.  No outpatient medications have been  marked as taking for the 08/31/22 encounter (Office Visit) with Juline Patch, MD.       08/31/2022    8:08 AM 08/13/2019   10:14 AM  GAD 7 : Generalized Anxiety Score  Nervous, Anxious, on Edge 2 0  Control/stop worrying 2 0  Worry too much - different things 2 0  Trouble relaxing 2 0  Restless 1 0  Easily annoyed or irritable 2 0  Afraid - awful might happen 3 0  Total GAD 7 Score 14 0  Anxiety Difficulty Very difficult        08/31/2022    8:07 AM 08/13/2019   10:13 AM 12/18/2017    1:50 PM  Depression screen PHQ 2/9  Decreased Interest 2 0 0  Down, Depressed, Hopeless 2 1 0  PHQ - 2 Score 4 1 0  Altered sleeping 3 0 1  Tired, decreased energy 2 0 0  Change in appetite 3 0 0  Feeling bad or failure about yourself   0 0  Trouble concentrating 2 0 0  Moving slowly or fidgety/restless 2 0 0  Suicidal thoughts 1 0 0  PHQ-9 Score 17 1 1  $ Difficult doing work/chores Very difficult Not difficult at all Not difficult at all    BP Readings from Last 3 Encounters:  08/31/22 128/78  02/25/20 136/88  03/03/19 121/82    Physical Exam Vitals and nursing note reviewed.  HENT:     Right Ear: Tympanic membrane and ear canal normal.     Left Ear: Tympanic membrane and ear canal normal.  Nose: Nose normal.     Mouth/Throat:     Mouth: Mucous membranes are moist.  Cardiovascular:     Rate and Rhythm: Normal rate and regular rhythm.     Heart sounds: No murmur heard.    No friction rub. No gallop.  Pulmonary:     Breath sounds: No wheezing, rhonchi or rales.     Wt Readings from Last 3 Encounters:  08/31/22 192 lb (87.1 kg)  02/25/20 185 lb (83.9 kg)  08/13/19 185 lb (83.9 kg)    BP 128/78   Pulse 87   Ht 5' 11"$  (1.803 m)   Wt 192 lb (87.1 kg)   SpO2 99%   BMI 26.78 kg/m   Assessment and Plan: 1. Reactive depression Chronic and a low-grade depression with recent escalation in intensity due to death of parent.  PHQ is 17 controlled however patient has  entertaining thoughts of self-harm with no arrangements.  Patient understands the cautionary nature of initiating certain medications given his age and circumstances and we have discussed with him and his older brother about proper approach would be initiating psychiatry.  Patient has recently applied for Medicaid and we are awaiting further progression on this in the meantime we will refer patient to psychiatry and have suggested walking and circumstance to discuss this with medical professional and cautionary decision Rosanne Gutting process of initiating medication.  2. Anxiety New onset with perhaps a low baseline but has escalated to 17 given recent passing of parent.  Although this would benefit from an SSRI for anxiety and depression I anticipate that we will need to have discussion at the professional/psychiatry level and we are arranging immediate attention at this time.   Otilio Miu, MD
